# Patient Record
Sex: Female | Born: 1965 | Race: White | Hispanic: No | Marital: Married | Smoking: Never smoker
Health system: Southern US, Community
[De-identification: ages and names within clinical notes are randomized; demographics above are authoritative.]

---

## 2015-01-17 DIAGNOSIS — N8111 Cystocele, midline: Secondary | ICD-10-CM | POA: Insufficient documentation

## 2015-01-17 DIAGNOSIS — N3946 Mixed incontinence: Secondary | ICD-10-CM | POA: Insufficient documentation

## 2015-02-14 DIAGNOSIS — E039 Hypothyroidism, unspecified: Secondary | ICD-10-CM | POA: Insufficient documentation

## 2018-01-24 DIAGNOSIS — K641 Second degree hemorrhoids: Secondary | ICD-10-CM | POA: Insufficient documentation

## 2019-02-28 DIAGNOSIS — N951 Menopausal and female climacteric states: Secondary | ICD-10-CM | POA: Insufficient documentation

## 2019-02-28 DIAGNOSIS — G4700 Insomnia, unspecified: Secondary | ICD-10-CM | POA: Insufficient documentation

## 2019-02-28 DIAGNOSIS — R002 Palpitations: Secondary | ICD-10-CM | POA: Insufficient documentation

## 2019-02-28 DIAGNOSIS — M2550 Pain in unspecified joint: Secondary | ICD-10-CM | POA: Insufficient documentation

## 2019-03-21 DIAGNOSIS — R5383 Other fatigue: Secondary | ICD-10-CM | POA: Insufficient documentation

## 2019-10-02 ENCOUNTER — Ambulatory Visit (INDEPENDENT_AMBULATORY_CARE_PROVIDER_SITE_OTHER): Payer: Self-pay | Admitting: Family Medicine

## 2019-10-02 ENCOUNTER — Ambulatory Visit: Payer: Self-pay

## 2019-10-02 ENCOUNTER — Other Ambulatory Visit: Payer: Self-pay

## 2019-10-02 ENCOUNTER — Encounter: Payer: Self-pay | Admitting: Family Medicine

## 2019-10-02 ENCOUNTER — Ambulatory Visit (INDEPENDENT_AMBULATORY_CARE_PROVIDER_SITE_OTHER): Payer: Self-pay

## 2019-10-02 VITALS — Ht 71.0 in | Wt 180.0 lb

## 2019-10-02 DIAGNOSIS — G8929 Other chronic pain: Secondary | ICD-10-CM

## 2019-10-02 DIAGNOSIS — M25559 Pain in unspecified hip: Secondary | ICD-10-CM

## 2019-10-02 DIAGNOSIS — M545 Low back pain, unspecified: Secondary | ICD-10-CM

## 2019-10-02 NOTE — Progress Notes (Signed)
Office Visit Note   Patient: Karen Cabrera           Date of Birth: April 01, 1966           MRN: [id number] Visit Date: 10/02/2019 Requested by: No referring provider defined for this encounter. PCP: Patient, No Pcp Per  Subjective: Chief Complaint  Patient presents with  . Piriformia    per Karen Cabrera at [facility] notifce 2 cysts on her Piriformis    HPI: She is seen at the request of Karen Cabrera for left posterior hip pain.  She has had longstanding intermittent problems with her low back.  In the past few months she has had troubles with pain in the posterior left hip with radiation down the leg to her foot.  She gets a numb and tingly feeling in her leg constantly.  She went to a physical therapist for 6 sessions of dry needling which seem to exacerbate her pain.  Then she recently went to [place] for another opinion, but Karen Cabrera was concerned about nodularity of the piriformis region and wanted this evaluated before proceeding with additional dry needling.  Patient denies any bowel or bladder dysfunction, denies fevers or chills.  She does have scoliosis and a family history of back problems.              ROS:   All other systems were reviewed and are negative.  Objective: Vital Signs: Ht 5\' 11"  (1.803 m)   Wt 180 lb (81.6 kg)   BMI 25.10 kg/m   Physical Exam:  General:  Alert and oriented, in no acute distress. Pulm:  Breathing unlabored. Psy:  Normal mood, congruent affect. Skin: No visible rash. Low back: Female chaperone was present.  She is tender in the piriformis region and has some subcutaneous nodularity.  Straight leg raise is negative, no pain with internal hip rotation.  Piriformis stretch increases pain.  Lower extremity strength and reflexes are normal.  Imaging: US Guided Needle Placement  Result Date: 10/02/2019 Limited diagnostic ultrasound left posterior hip: Female chaperone was present for the evaluation.  The piriformis was evaluated from the  posterior aspect of the greater trochanter to the level of the sacrum.  The nodules were palpated during the examination.  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  No cystic structures were seen.  They were not in the vicinity of any blood vessels.  XR Lumbar Spine 2-3 Views  Result Date: 10/02/2019 X-rays lumbar spine reveal mild to moderate diffuse degenerative disc disease, mild scoliosis.  There is anterolisthesis of L5 on S1 of 2 to 3 mm, probably due to facet DJD.  Hip joints are well-preserved.   Assessment & Plan: 1.  Chronic low back pain with left-sided sciatica, concerning for a lumbar foraminal stenosis. -Elected to proceed with lumbar MRI scan.  Depending on the results, could resume physical therapy with Karen Cabrera if no indication for epidural injection or surgery. -She did not want any medications.     Procedures: No procedures performed  No notes on file     PMFS History: There are no problems to display for this patient.  No past medical history on file.  No family history on file.   Social History   Occupational History  . Not on file  Tobacco Use  . Smoking status: Never Smoker  . Smokeless tobacco: Never Used  Substance and Sexual Activity  . Alcohol use: Not on file  . Drug use: Not on file  . Sexual activity: Not  on file

## 2019-10-31 ENCOUNTER — Encounter: Payer: Self-pay | Admitting: Family Medicine

## 2019-10-31 DIAGNOSIS — M25559 Pain in unspecified hip: Secondary | ICD-10-CM

## 2019-10-31 DIAGNOSIS — G8929 Other chronic pain: Secondary | ICD-10-CM

## 2019-10-31 DIAGNOSIS — M545 Low back pain, unspecified: Secondary | ICD-10-CM

## 2019-11-01 ENCOUNTER — Other Ambulatory Visit: Payer: Self-pay

## 2019-11-23 ENCOUNTER — Telehealth: Payer: Self-pay | Admitting: Family Medicine

## 2019-11-23 NOTE — Telephone Encounter (Signed)
Karen Cabrera has now completed more than 6 weeks worth of physical therapy and continues to have low back pain with radiation into the leg, and also left hip pain.  Unfortunately she hasn't made much progress.  We will order MRI of the lumbar spine and the left hip to further evaluate.

## 2019-11-23 NOTE — Addendum Note (Signed)
Addended by: Hortencia Pilar on: 11/23/2019 08:08 AM   Modules accepted: Orders

## 2019-12-08 ENCOUNTER — Other Ambulatory Visit: Payer: Self-pay | Admitting: Family Medicine

## 2019-12-08 DIAGNOSIS — M25552 Pain in left hip: Secondary | ICD-10-CM

## 2019-12-12 ENCOUNTER — Ambulatory Visit: Payer: 59

## 2019-12-12 ENCOUNTER — Encounter: Payer: Self-pay | Admitting: Family Medicine

## 2019-12-12 ENCOUNTER — Other Ambulatory Visit: Payer: Self-pay

## 2019-12-12 ENCOUNTER — Ambulatory Visit (INDEPENDENT_AMBULATORY_CARE_PROVIDER_SITE_OTHER): Payer: 59

## 2019-12-12 DIAGNOSIS — M25559 Pain in unspecified hip: Secondary | ICD-10-CM

## 2019-12-12 DIAGNOSIS — M25552 Pain in left hip: Secondary | ICD-10-CM | POA: Diagnosis not present

## 2019-12-12 NOTE — Progress Notes (Signed)
X-Rays of left hip reveal early spurring of the femoral head consistent with mild osteoarthritis.  No sign of AVN or neoplasm.  SI joints are sclerotic with moderate DJD.

## 2019-12-16 ENCOUNTER — Other Ambulatory Visit: Payer: 59

## 2020-01-03 ENCOUNTER — Encounter: Payer: Self-pay | Admitting: Family Medicine

## 2020-01-03 ENCOUNTER — Other Ambulatory Visit: Payer: Self-pay

## 2020-01-03 ENCOUNTER — Ambulatory Visit (INDEPENDENT_AMBULATORY_CARE_PROVIDER_SITE_OTHER): Payer: 59 | Admitting: Family Medicine

## 2020-01-03 DIAGNOSIS — G8929 Other chronic pain: Secondary | ICD-10-CM | POA: Diagnosis not present

## 2020-01-03 DIAGNOSIS — M25551 Pain in right hip: Secondary | ICD-10-CM | POA: Diagnosis not present

## 2020-01-03 DIAGNOSIS — M545 Low back pain, unspecified: Secondary | ICD-10-CM

## 2020-01-03 DIAGNOSIS — M25552 Pain in left hip: Secondary | ICD-10-CM

## 2020-01-03 NOTE — Progress Notes (Signed)
   Office Visit Note   Patient: Karen Cabrera           Date of Birth: Oct 10, 1965           MRN: [id number] Visit Date: 01/03/2020 Requested by: No referring provider defined for this encounter. PCP: Patient, No Pcp Per  Subjective: Chief Complaint  Patient presents with  . Right Hip - Pain    Right hip is not getting better with dry needling. Left hip and lower back are doing better.    HPI: She is here for follow-up right hip pain.  Left hip is doing much better with dry needling, but the right one has not responded.  Pain on the lateral aspect, constant pain but especially painful when walking.              ROS:   All other systems were reviewed and are negative.  Objective: Vital Signs: There were no vitals taken for this visit.  Physical Exam:  General:  Alert and oriented, in no acute distress. Pulm:  Breathing unlabored. Psy:  Normal mood, congruent affect.  Right hip: She is point tender at the superior lateral aspect of the greater trochanter.  No pain with internal/external rotation.  No pain with resisted strength testing.  Imaging: No results found.  Assessment & Plan: 1.  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  If this does not provide lasting relief, could try dextrose prolotherapy.     Procedures: Right hip injection: After sterile prep with Betadine, injected 8 cc 1% lidocaine without epinephrine and 40 mg methylprednisolone using a 22-gauge spinal needle passing the needle into the area of maximum tenderness.  She had good relief during the anesthetic phase.    PMFS History: Patient Active Problem List   Diagnosis Date Noted  . Fatigue 03/21/2019  . Arthralgia 02/28/2019  . Insomnia 02/28/2019  . Intermittent palpitations 02/28/2019  . Menopause syndrome 02/28/2019  . Grade II hemorrhoids 01/24/2018  . Hypothyroidism (acquired) 02/14/2015  .  Midline cystocele 01/17/2015  . Mixed incontinence 01/17/2015   History reviewed. No pertinent past medical history.  History reviewed. No pertinent family history.  History reviewed. No pertinent surgical history. Social History   Occupational History  . Not on file  Tobacco Use  . Smoking status: Never Smoker  . Smokeless tobacco: Never Used  Substance and Sexual Activity  . Alcohol use: Not on file  . Drug use: Not on file  . Sexual activity: Not on file

## 2020-01-31 ENCOUNTER — Encounter: Payer: Self-pay | Admitting: Family Medicine

## 2020-02-07 ENCOUNTER — Telehealth: Payer: Self-pay | Admitting: Family Medicine

## 2020-02-07 ENCOUNTER — Encounter: Payer: Self-pay | Admitting: Family Medicine

## 2020-02-07 NOTE — Telephone Encounter (Signed)
Karen Cabrera has struggled with chronic low back and left posterior hip pain.  She has been through extensive physical therapy treatments.  We have done a trigger point injection in the gluteus medius.  She continues to have daily pain.  It is my medical opinion that she should now undergo a lumbar MRI scan to further evaluate.  Please reconsider your denial of this test.

## 2020-02-08 ENCOUNTER — Encounter: Payer: Self-pay | Admitting: Family Medicine

## 2020-02-08 ENCOUNTER — Other Ambulatory Visit: Payer: Self-pay

## 2020-02-08 ENCOUNTER — Ambulatory Visit (INDEPENDENT_AMBULATORY_CARE_PROVIDER_SITE_OTHER): Payer: 59 | Admitting: Family Medicine

## 2020-02-08 DIAGNOSIS — M25551 Pain in right hip: Secondary | ICD-10-CM

## 2020-02-08 DIAGNOSIS — M5442 Lumbago with sciatica, left side: Secondary | ICD-10-CM

## 2020-02-08 DIAGNOSIS — G8929 Other chronic pain: Secondary | ICD-10-CM | POA: Diagnosis not present

## 2020-02-08 NOTE — Progress Notes (Signed)
Office Visit Note   Patient: Karen Cabrera           Date of Birth: 02-01-1966           MRN: [id number] Visit Date: 02/08/2020 Requested by: No referring provider defined for this encounter. PCP: Patient, No Pcp Per  Subjective: Chief Complaint  Patient presents with  . Right Hip - Pain  . Lower Back - Pain  . Left Foot - Numbness    HPI: She is here for follow-up chronic right lateral hip pain as well as low back and left posterior hip pain.  She continues to do physical therapy on a regular basis.  She has been to a total of roughly 20 sessions of physical therapy over a 51-month period of time.  We have done a trigger point injection in her gluteus medius as well as a greater trochanter injection.  Treatments give temporary relief, but the pain keeps coming back.  Her right lateral hip pain is constant.  Recently she started having left-sided sciatica with numbness and tingling in all of the toes.  She is very discouraged by her ongoing symptoms.  She does Pilates on a regular basis on her own.  She is trying everything she can to get rid of this pain.              ROS: No bowel or bladder incontinence.  All other systems were reviewed and are negative.  Objective: Vital Signs: There were no vitals taken for this visit.  Physical Exam:  General:  Alert and oriented, in no acute distress. Pulm:  Breathing unlabored. Psy:  Normal mood, congruent affect  Right hip: She remains point tender over the greater trochanter.  She has no pain with passive hip flexion and internal rotation. Low back: Negative bilateral straight leg raise.  Lower extremity strength and reflexes are still normal.  She has some dullness to light touch on the dorsal and plantar aspect of the small toes.   Imaging: No results found.  Assessment & Plan: 1.  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 -Discussed options and elected to try dextrose  prolotherapy.  First injection today, next 1 in 2 to 3 weeks and we will do a third after that depending on her response.  2.  Chronic low back pain with left-sided sciatica and numbness in the toes, persistent despite aggressive conservative therapy. -We will order lumbar MRI scan to further evaluate.  Based on results, could contemplate epidural injection or other options.     Procedures: Right hip injection: After sterile prep with Betadine, injected 6 cc 1% lidocaine without epinephrine and 4 cc 50% dextrose into the area of maximum tenderness.  Good relief during the anesthetic phase.    PMFS History: Patient Active Problem List   Diagnosis Date Noted  . Fatigue 03/21/2019  . Arthralgia 02/28/2019  . Insomnia 02/28/2019  . Intermittent palpitations 02/28/2019  . Menopause syndrome 02/28/2019  . Grade II hemorrhoids 01/24/2018  . Hypothyroidism (acquired) 02/14/2015  . Midline cystocele 01/17/2015  . Mixed incontinence 01/17/2015   History reviewed. No pertinent past medical history.  History reviewed. No pertinent family history.  History reviewed. No pertinent surgical history. Social History   Occupational History  . Not on file  Tobacco Use  . Smoking status: Never Smoker  . Smokeless tobacco: Never Used  Substance and Sexual Activity  . Alcohol use: Not on file  . Drug use: Not on file  .  Sexual activity: Not on file

## 2020-02-13 ENCOUNTER — Telehealth: Payer: Self-pay | Admitting: *Deleted

## 2020-02-13 NOTE — Telephone Encounter (Signed)
Appeals letter for bilateral hip MRI has been faxed to [phone] case # [id number]

## 2020-02-14 ENCOUNTER — Encounter: Payer: Self-pay | Admitting: Family Medicine

## 2020-02-23 ENCOUNTER — Telehealth: Payer: Self-pay | Admitting: Family Medicine

## 2020-02-23 ENCOUNTER — Other Ambulatory Visit: Payer: Self-pay

## 2020-02-23 ENCOUNTER — Ambulatory Visit
Admission: RE | Admit: 2020-02-23 | Discharge: 2020-02-23 | Disposition: A | Discharge status: Home / Self Care | Payer: 59 | Source: Ambulatory Visit | Attending: Family Medicine | Admitting: Family Medicine

## 2020-02-23 DIAGNOSIS — G8929 Other chronic pain: Secondary | ICD-10-CM

## 2020-02-23 DIAGNOSIS — M5442 Lumbago with sciatica, left side: Secondary | ICD-10-CM

## 2020-02-23 DIAGNOSIS — M25551 Pain in right hip: Secondary | ICD-10-CM

## 2020-02-23 NOTE — Telephone Encounter (Signed)
Lumbar MRI scan is notable for a left-sided disc protrusion at L2-3 which compresses the L3 nerve root.  This could certainly account for left-sided pain.

## 2020-03-06 NOTE — Addendum Note (Signed)
Addended by: Hortencia Pilar on: 03/06/2020 10:14 AM   Modules accepted: Orders

## 2020-03-12 ENCOUNTER — Encounter: Payer: Self-pay | Admitting: Family Medicine

## 2020-03-12 ENCOUNTER — Ambulatory Visit: Payer: 59 | Admitting: Radiology

## 2020-03-12 ENCOUNTER — Other Ambulatory Visit: Payer: Self-pay

## 2020-03-12 ENCOUNTER — Ambulatory Visit (INDEPENDENT_AMBULATORY_CARE_PROVIDER_SITE_OTHER): Payer: 59

## 2020-03-12 DIAGNOSIS — M546 Pain in thoracic spine: Secondary | ICD-10-CM

## 2020-03-12 DIAGNOSIS — G8929 Other chronic pain: Secondary | ICD-10-CM

## 2020-03-12 DIAGNOSIS — M5442 Lumbago with sciatica, left side: Secondary | ICD-10-CM

## 2020-03-12 DIAGNOSIS — M542 Cervicalgia: Secondary | ICD-10-CM

## 2020-03-12 NOTE — Progress Notes (Signed)
Here for Cervical and Thoracic imaging only.

## 2020-03-12 NOTE — Progress Notes (Unsigned)
Here for cervical and thoracic x-rays.  Cervical spine x-rays are notable for moderate to severe degenerative disc disease at C5-6, and moderate at C6-7.  There is retrolisthesis of C5 on C6 of about 4 mm.  Thoracic x-rays are notable for a mild thoracic scoliosis.  No significant degenerative changes, no sign of compression fracture or neoplasm.

## 2020-03-15 ENCOUNTER — Encounter: Payer: Self-pay | Admitting: Family Medicine

## 2020-03-15 ENCOUNTER — Other Ambulatory Visit: Payer: Self-pay

## 2020-03-15 ENCOUNTER — Ambulatory Visit (INDEPENDENT_AMBULATORY_CARE_PROVIDER_SITE_OTHER): Payer: 59 | Admitting: Family Medicine

## 2020-03-15 DIAGNOSIS — M4312 Spondylolisthesis, cervical region: Secondary | ICD-10-CM | POA: Diagnosis not present

## 2020-03-15 DIAGNOSIS — M542 Cervicalgia: Secondary | ICD-10-CM | POA: Diagnosis not present

## 2020-03-15 DIAGNOSIS — M25551 Pain in right hip: Secondary | ICD-10-CM | POA: Diagnosis not present

## 2020-03-15 DIAGNOSIS — M546 Pain in thoracic spine: Secondary | ICD-10-CM

## 2020-03-15 NOTE — Progress Notes (Signed)
Office Visit Note   Patient: Karen Cabrera           Date of Birth: 1965-10-23           MRN: [id number] Visit Date: 03/15/2020 Requested by: No referring provider defined for this encounter. PCP: Patient, No Pcp Per  Subjective: Chief Complaint  Patient presents with  . Right Hip - Pain    Dextrose injection # 2, intertroch  . discuss xray findings    HPI: She is here for follow-up chronic right hip pain.  Dextrose prolotherapy injection helped quite a bit.  She feels much better than she did before.  Is still hurts, but is much more tolerable.  She would like to try another injection or 2.  Meanwhile, she has been working extensively with Barbaraann Barthel for physical therapy.  She still has chronic low back pain with left-sided sciatica and numbness in the toes.  During evaluation, he was concerned about curvature in the rest of her spine so recently we obtained x-rays of the cervical and thoracic spine which showed mild thoracic scoliosis, but most concerning was in the cervical spine at C5-6 where she has retrolisthesis of C5 on 6 of about 4 mm.  She has degenerative disc disease at that level and at C6-7.  There is facet arthropathy as well.  She is not having any arm paresthesias or weakness.                ROS:   All other systems were reviewed and are negative.  Objective: Vital Signs: There were no vitals taken for this visit.  Physical Exam:  General:  Alert and oriented, in no acute distress. Pulm:  Breathing unlabored. Psy:  Normal mood, congruent affect.  She has good range of motion and strength of her upper extremities. Right hip: Point tender over the greater trochanter, but less than before.    Imaging:  Cervical spine x-rays are notable for moderate to severe degenerative disc disease at C5-6, and moderate at C6-7.  There is retrolisthesis of C5 on C6 of about 4 mm.  Thoracic x-rays are notable for a mild thoracic scoliosis.  No significant  degenerative changes, no sign of compression fracture or neoplasm.   Assessment & Plan: 1.  Chronic right hip pain, improving. -Elected to inject again with 6 cc 1% lidocaine without epinephrine and 4 cc 50% dextrose using a 22-gauge spinal needle into the area of maximum tenderness.  She had good relief during the anesthetic phase.  We can repeat this in a few weeks if needed.  2.  Chronic low back pain with left-sided sciatica, and with thoracic scoliosis and cervical C5-6 retrolisthesis.  Cannot rule out stenosis in the cervical spine. -We will proceed with MRI scan of the cervical spine to look for signs of stenosis.  If present, then will refer her to Dr. Ellene Route for surgical consideration.     Procedures: No procedures performed  No notes on file     PMFS History: Patient Active Problem List   Diagnosis Date Noted  . Fatigue 03/21/2019  . Arthralgia 02/28/2019  . Insomnia 02/28/2019  . Intermittent palpitations 02/28/2019  . Menopause syndrome 02/28/2019  . Grade II hemorrhoids 01/24/2018  . Hypothyroidism (acquired) 02/14/2015  . Midline cystocele 01/17/2015  . Mixed incontinence 01/17/2015   History reviewed. No pertinent past medical history.  History reviewed. No pertinent family history.  History reviewed. No pertinent surgical history. Social History   Occupational History  . Not  on file  Tobacco Use  . Smoking status: Never Smoker  . Smokeless tobacco: Never Used  Substance and Sexual Activity  . Alcohol use: Not on file  . Drug use: Not on file  . Sexual activity: Not on file

## 2020-04-01 ENCOUNTER — Encounter: Payer: Self-pay | Admitting: Family Medicine

## 2020-04-03 NOTE — Telephone Encounter (Signed)
I faxed over the appeal letter to her insurance

## 2020-04-09 ENCOUNTER — Encounter: Payer: Self-pay | Admitting: Family Medicine

## 2020-04-10 ENCOUNTER — Other Ambulatory Visit: Payer: 59

## 2020-05-04 ENCOUNTER — Other Ambulatory Visit: Payer: Self-pay

## 2020-05-04 ENCOUNTER — Ambulatory Visit
Admission: RE | Admit: 2020-05-04 | Discharge: 2020-05-04 | Disposition: A | Discharge status: Home / Self Care | Payer: 59 | Source: Ambulatory Visit | Attending: Family Medicine | Admitting: Family Medicine

## 2020-05-04 DIAGNOSIS — M542 Cervicalgia: Secondary | ICD-10-CM

## 2020-05-05 ENCOUNTER — Telehealth: Payer: Self-pay | Admitting: Family Medicine

## 2020-05-05 DIAGNOSIS — M25551 Pain in right hip: Secondary | ICD-10-CM

## 2020-05-05 DIAGNOSIS — M542 Cervicalgia: Secondary | ICD-10-CM

## 2020-05-05 DIAGNOSIS — G8929 Other chronic pain: Secondary | ICD-10-CM

## 2020-05-05 DIAGNOSIS — M545 Low back pain, unspecified: Secondary | ICD-10-CM

## 2020-05-05 DIAGNOSIS — M4312 Spondylolisthesis, cervical region: Secondary | ICD-10-CM

## 2020-05-05 NOTE — Telephone Encounter (Signed)
MRI shows multiple areas of degeneration in the neck.  There are disc bulges and bone spurs at C5-6 and C6-7 which result in stenosis/narrowing of the nerve openings, especially severe at the right C7 (This could cause right arm symptoms, but not lower body symptoms).  No significant narrowing of the spinal canal, which is good.    As long as pain is manageable and arm strength and sensation remain normal, no indication for surgery.  Could consider referral for injections if pain becomes more severe.

## 2020-05-08 NOTE — Addendum Note (Signed)
Addended by: Hortencia Pilar on: 05/08/2020 09:13 AM   Modules accepted: Orders

## 2020-07-01 ENCOUNTER — Encounter: Payer: Self-pay | Admitting: Family Medicine

## 2021-05-07 DIAGNOSIS — I3139 Other pericardial effusion (noninflammatory): Secondary | ICD-10-CM | POA: Insufficient documentation

## 2021-05-21 ENCOUNTER — Other Ambulatory Visit: Payer: Self-pay

## 2021-05-21 DIAGNOSIS — R202 Paresthesia of skin: Secondary | ICD-10-CM

## 2021-05-22 ENCOUNTER — Encounter: Payer: 59 | Admitting: Neurology

## 2022-01-06 ENCOUNTER — Ambulatory Visit: Payer: 59 | Admitting: Podiatry

## 2022-01-20 ENCOUNTER — Ambulatory Visit (INDEPENDENT_AMBULATORY_CARE_PROVIDER_SITE_OTHER): Payer: 59 | Admitting: Podiatry

## 2022-01-20 ENCOUNTER — Ambulatory Visit (INDEPENDENT_AMBULATORY_CARE_PROVIDER_SITE_OTHER): Payer: 59

## 2022-01-20 ENCOUNTER — Other Ambulatory Visit: Payer: Self-pay | Admitting: Podiatry

## 2022-01-20 ENCOUNTER — Encounter: Payer: Self-pay | Admitting: Podiatry

## 2022-01-20 DIAGNOSIS — M722 Plantar fascial fibromatosis: Secondary | ICD-10-CM

## 2022-01-20 DIAGNOSIS — M778 Other enthesopathies, not elsewhere classified: Secondary | ICD-10-CM

## 2022-01-20 DIAGNOSIS — D2230 Melanocytic nevi of unspecified part of face: Secondary | ICD-10-CM | POA: Insufficient documentation

## 2022-01-20 MED ORDER — METHYLPREDNISOLONE 4 MG PO TBPK: ORAL_TABLET | ORAL | 0 refills | Status: AC

## 2022-01-20 MED ORDER — TRIAMCINOLONE ACETONIDE 40 MG/ML IJ SUSP
20.0000 mg | Freq: Once | INTRAMUSCULAR | Status: AC
  Administered 2022-01-20: 20 mg

## 2022-01-20 MED ORDER — MELOXICAM 15 MG PO TABS: 15.0000 mg | ORAL_TABLET | Freq: Every day | ORAL | 3 refills | Status: AC

## 2022-01-20 NOTE — Patient Instructions (Signed)
Plantar Fasciitis Rehab Ask your health care provider which exercises are safe for you. Do exercises exactly as told by your health care provider and adjust them as directed. It is normal to feel mild stretching, pulling, tightness, or discomfort as you do these exercises. Stop right away if you feel sudden pain or your pain gets worse. Do not begin these exercises until told by your health care provider. Stretching and range-of-motion exercises These exercises warm up your muscles and joints and improve the movement and flexibility of your foot. These exercises also help to relieve pain. Plantar fascia stretch  Sit with your left / right leg crossed over your opposite knee. Hold your heel with one hand with that thumb near your arch. With your other hand, hold your toes and gently pull them back toward the top of your foot. You should feel a stretch on the base (bottom) of your toes, or the bottom of your foot (plantar fascia), or both. Hold this stretch for__________ seconds. Slowly release your toes and return to the starting position. Repeat __________ times. Complete this exercise __________ times a day. Gastrocnemius stretch, standing This exercise is also called a calf (gastroc) stretch. It stretches the muscles in the back of the upper calf. Stand with your hands against a wall. Extend your left / right leg behind you, and bend your front knee slightly. Keeping your heels on the floor, your toes facing forward, and your back knee straight, shift your weight toward the wall. Do not arch your back. You should feel a gentle stretch in your upper calf. Hold this position for __________ seconds. Repeat __________ times. Complete this exercise __________ times a day. Soleus stretch, standing This exercise is also called a calf (soleus) stretch. It stretches the muscles in the back of the lower calf. Stand with your hands against a wall. Extend your left / right leg behind you, and bend your  front knee slightly. Keeping your heels on the floor and your toes facing forward, bend your back knee and shift your weight slightly over your back leg. You should feel a gentle stretch deep in your lower calf. Hold this position for __________ seconds. Repeat __________ times. Complete this exercise __________ times a day. Gastroc and soleus stretch, standing step This exercise stretches the muscles in the back of the lower leg. These muscles are in the upper calf (gastrocnemius) and the lower calf (soleus). Stand with the ball of your left / right foot on the front of a step. The ball of your foot is on the walking surface, right under your toes. Keep your other foot firmly on the same step. Hold on to the wall or a railing for balance. Slowly lift your other foot, allowing your body weight to press your heel down over the edge of the front of the step. Keep knee straight and unbent. You should feel a stretch in your calf. Hold this position for __________ seconds. Return both feet to the step. Repeat this exercise with a slight bend in your left / right knee. Repeat __________ times with your left / right knee straight and __________ times with your left / right knee bent. Complete this exercise __________ times a day. Balance exercise This exercise builds your balance and strength control of your arch to help take pressure off your plantar fascia. Single leg stand If this exercise is too easy, you can try it with your eyes closed or while standing on a pillow. Without shoes, stand near a   railing or in a doorway. You may hold on to the railing or door frame as needed. Stand on your left / right foot. Keep your big toe down on the floor and lift the arch of your foot. You should feel a stretch across the bottom of your foot and your arch. Do not let your foot roll inward. Hold this position for __________ seconds. Repeat __________ times. Complete this exercise __________ times a day. This  information is not intended to replace advice given to you by your health care provider. Make sure you discuss any questions you have with your health care provider. Document Revised: 01/25/2020 Document Reviewed: 01/25/2020 [employer] Patient Education  2023 [employer] Plantar Fasciitis  Plantar fasciitis is a painful foot condition that affects the heel. It occurs when the band of tissue that connects the toes to the heel bone (plantar fascia) becomes irritated. This can happen as the result of exercising too much or doing other repetitive activities (overuse injury). Plantar fasciitis can cause mild irritation to severe pain that makes it difficult to walk or move. The pain is usually worse in the morning after sleeping, or after sitting or lying down for a period of time. Pain may also be worse after long periods of walking or standing. What are the causes? This condition may be caused by: Standing for long periods of time. Wearing shoes that do not have good arch support. Doing activities that put stress on joints (high-impact activities). This includes ballet and exercise that makes your heart beat faster (aerobic exercise), such as running. Being overweight. An abnormal way of walking (gait). Tight muscles in the back of your lower leg (calf). High arches in your feet or flat feet. Starting a new athletic activity. What are the signs or symptoms? The main symptom of this condition is heel pain. Pain may get worse after the following: Taking the first steps after a time of rest, especially in the morning after awakening, or after you have been sitting or lying down for a while. Long periods of standing still. Pain may decrease after 30-45 minutes of activity, such as gentle walking. How is this diagnosed? This condition may be diagnosed based on your medical history, a physical exam, and your symptoms. Your health care provider will check for: A tender area on the bottom of your  foot. A high arch in your foot or flat feet. Pain when you move your foot. Difficulty moving your foot. You may have imaging tests to confirm the diagnosis, such as: X-rays. Ultrasound. MRI. How is this treated? Treatment for plantar fasciitis depends on how severe your condition is. Treatment may include: Rest, ice, pressure (compression), and raising (elevating) the affected foot. This is called RICE therapy. Your health care provider may recommend RICE therapy along with over-the-counter pain medicines to manage your pain. Exercises to stretch your calves and your plantar fascia. A splint that holds your foot in a stretched, upward position while you sleep (night splint). Physical therapy to relieve symptoms and prevent problems in the future. Injections of steroid medicine (cortisone) to relieve pain and inflammation. Stimulating your plantar fascia with electrical impulses (extracorporeal shock wave therapy). This is usually the last treatment option before surgery. Surgery, if other treatments have not worked after 12 months. Follow these instructions at home: Managing pain, stiffness, and swelling  If directed, put ice on the painful area. To do this: Put ice in a plastic bag, or use a frozen bottle of water. Place a towel   between your skin and the bag or bottle. Roll the bottom of your foot over the bag or bottle. Do this for 20 minutes, 2-3 times a day. Wear athletic shoes that have air-sole or gel-sole cushions, or try soft shoe inserts that are designed for plantar fasciitis. Elevate your foot above the level of your heart while you are sitting or lying down. Activity Avoid activities that cause pain. Ask your health care provider what activities are safe for you. Do physical therapy exercises and stretches as told by your health care provider. Try activities and forms of exercise that are easier on your joints (low impact). Examples include swimming, water aerobics, and  biking. General instructions Take over-the-counter and prescription medicines only as told by your health care provider. Wear a night splint while sleeping, if told by your health care provider. Loosen the splint if your toes tingle, become numb, or turn cold and blue. Maintain a healthy weight, or work with your health care provider to lose weight as needed. Keep all follow-up visits. This is important. Contact a health care provider if you have: Symptoms that do not go away with home treatment. Pain that gets worse. Pain that affects your ability to move or do daily activities. Summary Plantar fasciitis is a painful foot condition that affects the heel. It occurs when the band of tissue that connects the toes to the heel bone (plantar fascia) becomes irritated. Heel pain is the main symptom of this condition. It may get worse after exercising too much or standing still for a long time. Treatment varies, but it usually starts with rest, ice, pressure (compression), and raising (elevating) the affected foot. This is called RICE therapy. Over-the-counter medicines can also be used to manage pain. This information is not intended to replace advice given to you by your health care provider. Make sure you discuss any questions you have with your health care provider. Document Revised: 07/31/2019 Document Reviewed: 07/31/2019 Elsevier Patient Education  2023 [employer]  

## 2022-01-21 NOTE — Progress Notes (Signed)
Subjective:  Patient ID: Karen Cabrera, female    DOB: 19-Oct-1965,  MRN: [id number] HPI Chief Complaint  Patient presents with   Foot Pain    Plantar/dorsal forefoot left - aching x several months, wears orthotics, also tried met pads, weird sensations in 2nd and 3rd toes left, Dr. Noemi Chapel referred   New Patient (Initial Visit)    56 y.o. female presents with the above complaint.   ROS: Denies fever chills nausea vomiting muscle aches pains calf pain back pain chest pain shortness of breath.  No past medical history on file. No past surgical history on file.  Current Outpatient Medications:    ESTROGENS, CONJUGATED VA, Place vaginally., Disp: , Rfl:    GLUCOSAMINE-CHONDROITIN PO, Take by mouth., Disp: , Rfl:    meloxicam (MOBIC) 15 MG tablet, Take 1 tablet (15 mg total) by mouth daily., Disp: 30 tablet, Rfl: 3   methylPREDNISolone (MEDROL DOSEPAK) 4 MG TBPK tablet, 6 day dose pack - take as directed, Disp: 21 tablet, Rfl: 0   Cholecalciferol (VITAMIN D3 PO), Take by mouth daily., Disp: , Rfl:    Cyanocobalamin (VITAMIN B12 SL), Place under the tongue., Disp: , Rfl:    levothyroxine (SYNTHROID) 112 MCG tablet, Take by mouth., Disp: , Rfl:   Allergies  Allergen Reactions   Estradiol Itching and Rash    Estradiol vaginal cream   Levothyroxine Sodium     Other reaction(s): Other (See Comments)   Review of Systems Objective:  There were no vitals filed for this visit.  General: Well developed, nourished, in no acute distress, alert and oriented x3   Dermatological: Skin is warm, dry and supple bilateral. Nails x 10 are well maintained; remaining integument appears unremarkable at this time. There are no open sores, no preulcerative lesions, no rash or signs of infection present.  Vascular: Dorsalis Pedis artery and Posterior Tibial artery pedal pulses are 2/4 bilateral with immedate capillary fill time. Pedal hair growth present. No varicosities and no lower extremity edema  present bilateral.   Neruologic: Grossly intact via light touch bilateral. Vibratory intact via tuning fork bilateral. Protective threshold with Semmes Wienstein monofilament intact to all pedal sites bilateral. Patellar and Achilles deep tendon reflexes 2+ bilateral. No Babinski or clonus noted bilateral.   Musculoskeletal: No gross boney pedal deformities bilateral. No pain, crepitus, or limitation noted with foot and ankle range of motion bilateral. Muscular strength 5/5 in all groups tested bilateral.  She has pain on palpation of the medial calcaneal tubercle of the left heel.  She also has pain on palpation of the central calcaneal tubercle.  She has pain on palpation of the fourth fifth tarsometatarsal joint as well as range of motion of that same joint.  She has tenderness on palpation of the forefoot at the metatarsal phalangeal joints and findings characteristic of neuroma third interdigital space of the same foot.  Gait: Unassisted, Nonantalgic.    Radiographs:  Radiographs taken today demonstrate an osseously mature foot rectus in nature small plantar distally oriented calcaneal heel spur with a soft tissue increase in density at the plantar fascial calcaneal insertion site.  She also has a very small posterior approximately oriented calcaneal spur with noted thickening of the Achilles.  Otherwise no acute findings are noted.  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  Plan: Discussed etiology pathology conservative versus surgical therapies discussed appropriate shoe gear stretching  exercise ice therapy and shoe gear modifications.  I injected her left heel today started her on a Medrol Dosepak to be followed by meloxicam.  I would like to follow-up with her in 1 month.     Karen Cabrera, [place]

## 2022-03-05 ENCOUNTER — Ambulatory Visit: Payer: 59 | Admitting: Podiatry

## 2022-03-12 ENCOUNTER — Ambulatory Visit: Payer: 59 | Admitting: Podiatry
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
## 2022-04-23 ENCOUNTER — Ambulatory Visit: Payer: 59 | Admitting: Podiatry

## 2022-12-18 ENCOUNTER — Other Ambulatory Visit: Payer: Self-pay | Admitting: Orthopedic Surgery

## 2022-12-18 DIAGNOSIS — M25562 Pain in left knee: Secondary | ICD-10-CM

## 2022-12-31 ENCOUNTER — Ambulatory Visit
Admission: RE | Admit: 2022-12-31 | Discharge: 2022-12-31 | Disposition: A | Discharge status: Home / Self Care | Payer: 59 | Source: Ambulatory Visit | Attending: Orthopedic Surgery | Admitting: Orthopedic Surgery

## 2022-12-31 DIAGNOSIS — M25562 Pain in left knee: Secondary | ICD-10-CM
